# Patient Record
Sex: Male | Born: 1995 | Race: White | Hispanic: No | Marital: Married | Smoking: Never smoker
Health system: Southern US, Community
[De-identification: ages and names within clinical notes are randomized; demographics above are authoritative.]

## PROBLEM LIST (undated history)

## (undated) DIAGNOSIS — M25519 Pain in unspecified shoulder: Secondary | ICD-10-CM

## (undated) DIAGNOSIS — Z8719 Personal history of other diseases of the digestive system: Secondary | ICD-10-CM

## (undated) DIAGNOSIS — Z8709 Personal history of other diseases of the respiratory system: Secondary | ICD-10-CM

## (undated) DIAGNOSIS — M7542 Impingement syndrome of left shoulder: Secondary | ICD-10-CM

## (undated) DIAGNOSIS — R203 Hyperesthesia: Secondary | ICD-10-CM

## (undated) HISTORY — PX: VASECTOMY: SHX75

---
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
## 2015-09-18 ENCOUNTER — Emergency Department (HOSPITAL_COMMUNITY)
Admission: EM | Admit: 2015-09-18 | Discharge: 2015-09-19 | Disposition: A | Discharge status: Home / Self Care | Payer: Worker's Compensation | Attending: Emergency Medicine | Admitting: Emergency Medicine

## 2015-09-18 ENCOUNTER — Encounter (HOSPITAL_COMMUNITY): Payer: Self-pay

## 2015-09-18 DIAGNOSIS — J45909 Unspecified asthma, uncomplicated: Secondary | ICD-10-CM | POA: Insufficient documentation

## 2015-09-18 DIAGNOSIS — Z8719 Personal history of other diseases of the digestive system: Secondary | ICD-10-CM | POA: Insufficient documentation

## 2015-09-18 DIAGNOSIS — Z8739 Personal history of other diseases of the musculoskeletal system and connective tissue: Secondary | ICD-10-CM | POA: Diagnosis not present

## 2015-09-18 DIAGNOSIS — E860 Dehydration: Secondary | ICD-10-CM | POA: Diagnosis not present

## 2015-09-18 DIAGNOSIS — R42 Dizziness and giddiness: Secondary | ICD-10-CM

## 2015-09-18 LAB — CBC WITH DIFFERENTIAL/PLATELET
BASOS ABS: 0 10*3/uL (ref 0.0–0.1)
BASOS PCT: 1 %
EOS ABS: 0.1 10*3/uL (ref 0.0–0.7)
Eosinophils Relative: 1 %
HCT: 35.7 % — ABNORMAL LOW (ref 39.0–52.0)
HEMOGLOBIN: 12.7 g/dL — AB (ref 13.0–17.0)
Lymphocytes Relative: 19 %
Lymphs Abs: 1.2 10*3/uL (ref 0.7–4.0)
MCH: 31.3 pg (ref 26.0–34.0)
MCHC: 35.6 g/dL (ref 30.0–36.0)
MCV: 87.9 fL (ref 78.0–100.0)
Monocytes Absolute: 0.6 10*3/uL (ref 0.1–1.0)
Monocytes Relative: 10 %
NEUTROS PCT: 69 %
Neutro Abs: 4.3 10*3/uL (ref 1.7–7.7)
Platelets: 152 10*3/uL (ref 150–400)
RBC: 4.06 MIL/uL — AB (ref 4.22–5.81)
RDW: 12.1 % (ref 11.5–15.5)
WBC: 6.2 10*3/uL (ref 4.0–10.5)

## 2015-09-18 LAB — BASIC METABOLIC PANEL
Anion gap: 5 (ref 5–15)
BUN: 14 mg/dL (ref 6–20)
CHLORIDE: 109 mmol/L (ref 101–111)
CO2: 25 mmol/L (ref 22–32)
CREATININE: 1.25 mg/dL — AB (ref 0.61–1.24)
Calcium: 8.4 mg/dL — ABNORMAL LOW (ref 8.9–10.3)
GFR calc non Af Amer: >60 mL/min (ref >60)
Glucose, Bld: 111 mg/dL — ABNORMAL HIGH (ref 65–99)
POTASSIUM: 4.1 mmol/L (ref 3.5–5.1)
SODIUM: 139 mmol/L (ref 135–145)

## 2015-09-18 MED ORDER — SODIUM CHLORIDE 0.9 % IV BOLUS (SEPSIS)
1000.0000 mL | Freq: Once | INTRAVENOUS | Status: AC
  Administered 2015-09-19: 1000 mL via INTRAVENOUS

## 2015-09-18 MED ORDER — SODIUM CHLORIDE 0.9 % IV BOLUS (SEPSIS): 1000.0000 mL | Freq: Once | INTRAVENOUS | Status: DC

## 2015-09-18 MED ORDER — MECLIZINE HCL 12.5 MG PO TABS
25.0000 mg | ORAL_TABLET | Freq: Once | ORAL | Status: AC
  Administered 2015-09-19: 25 mg via ORAL
  Filled 2015-09-18: qty 2

## 2015-09-18 NOTE — ED Notes (Signed)
Patient stated he felt dizzy while sitting, dizziness increased upon standing. Patient states he still feels like he is burning up.

## 2015-09-18 NOTE — ED Provider Notes (Signed)
CSN: [id number]     Arrival date & time 09/18/15  2206 History  By signing my name below, I, Gregory Murray, attest that this documentation has been prepared under the direction and in the presence of Shon Baton, MD. Electronically Signed: [name], ED Scribe. 09/18/2015. 11:30 PM.    Chief Complaint  Patient presents with  . Dizziness   The history is provided by the patient. No language interpreter was used.   HPI Comments: Gregory Murray is a 20 y.o. male with a PMHx of asthma who presents to the Emergency Department complaining of intermittent off-balance, room spinning, lightheaded dizziness onset 3 weeks ago, worsening significantly as of tonight. He notes he currently just feels "al little bit of room spinning." He denies a any recent drug or alcohol use. He notes exacerbation of the dizziness with head movement. He notes he has been staying well-hydrated. 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 Pt denies headaches, nausea, and fever.  Pt is allergic to morphine.   Past Medical History  Diagnosis Date  . Impingement syndrome, shoulder, left 05/2012    pain wakes him up at night  . AC joint pain 05/2012    left  . Sensitive skin     dryness face, neck, abd.  . History of asthma     as a child  . History of constipation     as a child   Past Surgical History  Procedure Laterality Date  . Knee arthroscopy w/ plica excision  12/04/2009    left  . Upper gi endoscopy  11/06/2006    had general anesthesia  . Meatotomy  04/23/2006  . Closed reduction nasal fracture  10/29/2004  . Shoulder arthroscopy  06/01/2012    Procedure: ARTHROSCOPY SHOULDER;  Surgeon: Velna Ochs, MD;  Location: [facility];  Service: Orthopedics;  Laterality: Left;  left shoulder arthroscopy with debridement and subacromial decompression   Family History  Problem Relation Age of Onset  . Hypertension Maternal Grandmother   . Heart disease Maternal Grandmother   . Diabetes  Maternal Grandfather   . Hypertension Maternal Grandfather   . Heart disease Maternal Grandfather   . Hypertension Paternal Grandmother   . Heart disease Paternal Grandmother   . Kidney disease Paternal Grandmother   . Hypertension Paternal Grandfather   . Heart disease Paternal Grandfather   . COPD Paternal Grandfather   . Seizures Mother     as a child  . Anesthesia problems Mother     wakes up aggressive and mean   Social History  Substance Use Topics  . Smoking status: Passive Smoke Exposure - Never Smoker  . Smokeless tobacco: Never Used     Comment: outside smokers at home  . Alcohol Use: No    Review of Systems  Constitutional: Negative for fever.  Gastrointestinal: Negative for nausea.  Neurological: Positive for dizziness and light-headedness. Negative for headaches.  All other systems reviewed and are negative.   Allergies  Morphine and related; Nickel; and Soap  Home Medications   Prior to Admission medications   Medication Sig Start Date End Date Taking? Authorizing Provider  meclizine (ANTIVERT) 25 MG tablet Take 1 tablet (25 mg total) by mouth 2 (two) times daily as needed for dizziness. 09/19/15   Shon Baton, MD   BP 114/73 mmHg  Pulse 69  Temp(Src) 97.9 F (36.6 C) (Oral)  Resp 18  Ht  (1.854 m)  Wt 175 lb (79.379 kg)  BMI  23.09 kg/m2  SpO2 100% Physical Exam  Constitutional: He is oriented to person, place, and time. He appears well-developed and well-nourished. No distress.  HENT:  Head: Normocephalic and atraumatic.  Mucous membranes moist  Eyes: Pupils are equal, round, and reactive to light.  Horizontal nystagmus noted on exam  Neck: Neck supple.  Cardiovascular: Normal rate, regular rhythm and normal heart sounds.   No murmur heard. Pulmonary/Chest: Effort normal and breath sounds normal. No respiratory distress. He has no wheezes.  Abdominal: Soft. Bowel sounds are normal. There is no tenderness. There is no rebound.   Musculoskeletal: He exhibits no edema.  Neurological: He is alert and oriented to person, place, and time.  Cranial nerves II through XII intact, 5 out of 5 strength in all 4 extremities  Skin: Skin is warm and dry.  Psychiatric: He has a normal mood and affect.  Nursing note and vitals reviewed.   ED Course  Procedures  DIAGNOSTIC STUDIES: Oxygen Saturation is 100% on RA, normal by my interpretation.    COORDINATION OF CARE: 11:29 PM - Discussed probable vertigo and plans to order diagnostic studies. Pt advised of plan for treatment and pt agrees.  Labs Review Labs Reviewed  CBC WITH DIFFERENTIAL/PLATELET - Abnormal; Notable for the following:    RBC 4.06 (*)    Hemoglobin 12.7 (*)    HCT 35.7 (*)    All other components within normal limits  BASIC METABOLIC PANEL - Abnormal; Notable for the following:    Glucose, Bld 111 (*)    Creatinine, Ser 1.25 (*)    Calcium 8.4 (*)    All other components within normal limits    Imaging Review No results found. I have personally reviewed and evaluated these images and lab results as part of my medical decision-making.   EKG Interpretation   Date/Time:  Tuesday September 18 2015 23:09:57 EST Ventricular Rate:  71 PR Interval:  114 QRS Duration: 87 QT Interval:  371 QTC Calculation: 403 R Axis:   73 Text Interpretation:  Sinus rhythm Borderline short PR interval Borderline  Q waves in lateral leads Early repolarization No prior for conparison  Confirmed by Brandie Lopes  MD, Mikaelyn Arthurs ([id number]) on 09/19/2015 1:21:28 AM      MDM   Final diagnoses:  Vertigo  Dehydration    Patient presents with dizziness. Ongoing for the last several weeks but worse tonight. He is nontoxic on exam. Nonfocal. He does describe the room spinning and off balance dizziness. Heart rate does elevate greater than 20 upon standing. There may be an elephant of orthostasis or dehydration. Patient was given fluids. However, given the nystatin was on exam,  vertigo is also a consideration. Patient was given meclizine. EKG is largely reassuring and basic labwork is unremarkable. Patient reports improvement of symptoms after fluids and meclizine.  After history, exam, and medical workup I feel the patient has been appropriately medically screened and is safe for discharge home. Pertinent diagnoses were discussed with the patient. Patient was given return precautions.  ,I personally performed the services described in this documentation, which was scribed in my presence. The recorded information has been reviewed and is accurate.   Shon Baton, MD 09/19/15 [phone]
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
## 2015-09-19 MED ORDER — MECLIZINE HCL 25 MG PO TABS: 25.0000 mg | ORAL_TABLET | Freq: Two times a day (BID) | ORAL | Status: DC | PRN

## 2015-09-19 NOTE — ED Notes (Signed)
Pt was ambulated around the nurses station a few times. Pt dines short of breath and no dizziness.

## 2015-09-19 NOTE — Discharge Instructions (Signed)
Benign Positional Vertigo °Vertigo is the feeling that you or your surroundings are moving when they are not. Benign positional vertigo is the most common form of vertigo. The cause of this condition is not serious (is benign). This condition is triggered by certain movements and positions (is positional). This condition can be dangerous if it occurs while you are doing something that could endanger you or others, such as driving.  °CAUSES °In many cases, the cause of this condition is not known. It may be caused by a disturbance in an area of the inner ear that helps your brain to sense movement and balance. This disturbance can be caused by a viral infection (labyrinthitis), head injury, or repetitive motion. °RISK FACTORS °This condition is more likely to develop in: °· Women. °· People who are 50 years of age or older. °SYMPTOMS °Symptoms of this condition usually happen when you move your head or your eyes in different directions. Symptoms may start suddenly, and they usually last for less than a minute. Symptoms may include: °· Loss of balance and falling. °· Feeling like you are spinning or moving. °· Feeling like your surroundings are spinning or moving. °· Nausea and vomiting. °· Blurred vision. °· Dizziness. °· Involuntary eye movement (nystagmus). °Symptoms can be mild and cause only slight annoyance, or they can be severe and interfere with daily life. Episodes of benign positional vertigo may return (recur) over time, and they may be triggered by certain movements. Symptoms may improve over time. °DIAGNOSIS °This condition is usually diagnosed by medical history and a physical exam of the head, neck, and ears. You may be referred to a health care provider who specializes in ear, nose, and throat (ENT) problems (otolaryngologist) or a provider who specializes in disorders of the nervous system (neurologist). You may have additional testing, including: °· MRI. °· A CT scan. °· Eye movement tests. Your  health care provider may ask you to change positions quickly while he or she watches you for symptoms of benign positional vertigo, such as nystagmus. Eye movement may be tested with an electronystagmogram (ENG), caloric stimulation, the Dix-Hallpike test, or the roll test. °· An electroencephalogram (EEG). This records electrical activity in your brain. °· Hearing tests. °TREATMENT °Usually, your health care provider will treat this by moving your head in specific positions to adjust your inner ear back to normal. Surgery may be needed in severe cases, but this is rare. In some cases, benign positional vertigo may resolve on its own in 2-4 weeks. °HOME CARE INSTRUCTIONS °Safety °· Move slowly. Avoid sudden body or head movements. °· Avoid driving. °· Avoid operating heavy machinery. °· Avoid doing any tasks that would be dangerous to you or others if a vertigo episode would occur. °· If you have trouble walking or keeping your balance, try using a cane for stability. If you feel dizzy or unstable, sit down right away. °· Return to your normal activities as told by your health care provider. Ask your health care provider what activities are safe for you. °General Instructions °· Take over-the-counter and prescription medicines only as told by your health care provider. °· Avoid certain positions or movements as told by your health care provider. °· Drink enough fluid to keep your urine clear or pale yellow. °· Keep all follow-up visits as told by your health care provider. This is important. °SEEK MEDICAL CARE IF: °· You have a fever. °· Your condition gets worse or you develop new symptoms. °· Your family or friends   notice any behavioral changes. °· Your nausea or vomiting gets worse. °· You have numbness or a "pins and needles" sensation. °SEEK IMMEDIATE MEDICAL CARE IF: °· You have difficulty speaking or moving. °· You are always dizzy. °· You faint. °· You develop severe headaches. °· You have weakness in your  legs or arms. °· You have changes in your hearing or vision. °· You develop a stiff neck. °· You develop sensitivity to light. °  °This information is not intended to replace advice given to you by your health care provider. Make sure you discuss any questions you have with your health care provider. °  °Document Released: 04/14/2006 Document Revised: 03/28/2015 Document Reviewed: 10/30/2014 °Elsevier Interactive Patient Education ©2016 [employer] ° °Dehydration, Adult °Dehydration is a condition in which you do not have enough fluid or water in your body. It happens when you take in less fluid than you lose. Vital organs such as the kidneys, brain, and heart cannot function without a proper amount of fluids. Any loss of fluids from the body can cause dehydration.  °Dehydration can range from mild to severe. This condition should be treated right away to help prevent it from becoming severe. °CAUSES  °This condition may be caused by: °· Vomiting. °· Diarrhea. °· Excessive sweating, such as when exercising in hot or humid weather. °· Not drinking enough fluid during strenuous exercise or during an illness. °· Excessive urine output. °· Fever. °· Certain medicines. °RISK FACTORS °This condition is more likely to develop in: °· People who are taking certain medicines that cause the body to lose excess fluid (diuretics).   °· People who have a chronic illness, such as diabetes, that may increase urination. °· Older adults.   °· People who live at high altitudes.   °· People who participate in endurance sports.   °SYMPTOMS  °Mild Dehydration °· Thirst. °· Dry lips. °· Slightly dry mouth. °· Dry, warm skin. °Moderate Dehydration °· Very dry mouth.   °· Muscle cramps.   °· Dark urine and decreased urine production.   °· Decreased tear production.   °· Headache.   °· Light-headedness, especially when you stand up from a sitting position.   °Severe Dehydration °· Changes in skin.   °¨ Cold and clammy skin.   °¨ Skin does  not spring back quickly when lightly pinched and released.   °· Changes in body fluids.   °¨ Extreme thirst.   °¨ No tears.   °¨ Not able to sweat when body temperature is high, such as in hot weather.   °¨ Minimal urine production.   °· Changes in vital signs.   °¨ Rapid, weak pulse (more than 100 beats per minute when you are sitting still).   °¨ Rapid breathing.   °¨ Low blood pressure.   °· Other changes.   °¨ Sunken eyes.   °¨ Cold hands and feet.   °¨ Confusion. °¨ Lethargy and difficulty being awakened. °¨ Fainting (syncope).   °¨ Short-term weight loss.   °¨ Unconsciousness. °DIAGNOSIS  °This condition may be diagnosed based on your symptoms. You may also have tests to determine how severe your dehydration is. These tests may include:  °· Urine tests.   °· Blood tests.   °TREATMENT  °Treatment for this condition depends on the severity. Mild or moderate dehydration can often be treated at home. Treatment should be started right away. Do not wait until dehydration becomes severe. Severe dehydration needs to be treated at the hospital. °Treatment for Mild Dehydration °· Drinking plenty of water to replace the fluid you have lost.   °· Replacing minerals in your blood (electrolytes) that you may have   Treatment for Moderate Dehydration  Consuming oral rehydration solution (ORS). Treatment for Severe Dehydration  Receiving fluid through an IV tube.   Receiving electrolyte solution through a feeding tube that is passed through your nose and into your stomach (nasogastric tube or NG tube).  Correcting any abnormalities in electrolytes. HOME CARE INSTRUCTIONS   Drink enough fluid to keep your urine clear or pale yellow.   Drink water or fluid slowly by taking small sips. You can also try sucking on ice cubes.  Have food or beverages that contain electrolytes. Examples include bananas and sports drinks.  Take over-the-counter and prescription medicines only as told by your health care  provider.   Prepare ORS according to the manufacturer's instructions. Take sips of ORS every 5 minutes until your urine returns to normal.  If you have vomiting or diarrhea, continue to try to drink water, ORS, or both.   If you have diarrhea, avoid:   Beverages that contain caffeine.   Fruit juice.   Milk.   Carbonated soft drinks.  Do not take salt tablets. This can lead to the condition of having too much sodium in your body (hypernatremia).  SEEK MEDICAL CARE IF:  You cannot eat or drink without vomiting.  You have had moderate diarrhea during a period of more than 24 hours.  You have a fever. SEEK IMMEDIATE MEDICAL CARE IF:   You have extreme thirst.  You have severe diarrhea.  You have not urinated in 6-8 hours, or you have urinated only a small amount of very dark urine.  You have shriveled skin.  You are dizzy, confused, or both.   This information is not intended to replace advice given to you by your health care provider. Make sure you discuss any questions you have with your health care provider.   Document Released: 07/07/2005 Document Revised: 03/28/2015 Document Reviewed: 11/22/2014 Elsevier Interactive Patient Education [employer]

## 2015-09-19 NOTE — ED Notes (Signed)
Pt received 1000 ml NS bolus from EMS

## 2015-09-19 NOTE — ED Notes (Signed)
Patient given discharge instruction, verbalized understand. IV removed, band aid applied. Patient ambulatory out of the department.  

## 2020-02-15 DIAGNOSIS — M2392 Unspecified internal derangement of left knee: Secondary | ICD-10-CM | POA: Diagnosis not present

## 2020-02-15 DIAGNOSIS — E663 Overweight: Secondary | ICD-10-CM | POA: Diagnosis not present

## 2020-02-15 DIAGNOSIS — M25562 Pain in left knee: Secondary | ICD-10-CM | POA: Diagnosis not present

## 2020-02-15 DIAGNOSIS — S8392XA Sprain of unspecified site of left knee, initial encounter: Secondary | ICD-10-CM | POA: Diagnosis not present

## 2020-03-13 DIAGNOSIS — M25562 Pain in left knee: Secondary | ICD-10-CM | POA: Diagnosis not present

## 2020-03-13 DIAGNOSIS — M2392 Unspecified internal derangement of left knee: Secondary | ICD-10-CM | POA: Diagnosis not present

## 2020-04-13 DIAGNOSIS — M25562 Pain in left knee: Secondary | ICD-10-CM | POA: Diagnosis not present

## 2020-04-13 DIAGNOSIS — M2392 Unspecified internal derangement of left knee: Secondary | ICD-10-CM | POA: Diagnosis not present

## 2021-02-13 DIAGNOSIS — Z3009 Encounter for other general counseling and advice on contraception: Secondary | ICD-10-CM | POA: Diagnosis not present

## 2021-05-13 DIAGNOSIS — Z302 Encounter for sterilization: Secondary | ICD-10-CM | POA: Diagnosis not present

## 2021-05-30 DIAGNOSIS — R6889 Other general symptoms and signs: Secondary | ICD-10-CM | POA: Diagnosis not present

## 2021-07-29 DIAGNOSIS — Z0000 Encounter for general adult medical examination without abnormal findings: Secondary | ICD-10-CM | POA: Diagnosis not present

## 2021-07-29 DIAGNOSIS — R454 Irritability and anger: Secondary | ICD-10-CM | POA: Diagnosis not present

## 2021-07-29 DIAGNOSIS — Z6830 Body mass index (BMI) 30.0-30.9, adult: Secondary | ICD-10-CM | POA: Diagnosis not present

## 2021-07-29 DIAGNOSIS — E669 Obesity, unspecified: Secondary | ICD-10-CM | POA: Diagnosis not present

## 2021-07-29 DIAGNOSIS — E782 Mixed hyperlipidemia: Secondary | ICD-10-CM | POA: Diagnosis not present

## 2021-07-29 DIAGNOSIS — Z13220 Encounter for screening for lipoid disorders: Secondary | ICD-10-CM | POA: Diagnosis not present

## 2021-07-29 DIAGNOSIS — R0683 Snoring: Secondary | ICD-10-CM | POA: Diagnosis not present

## 2021-07-29 DIAGNOSIS — R5383 Other fatigue: Secondary | ICD-10-CM | POA: Diagnosis not present

## 2021-08-05 DIAGNOSIS — G4730 Sleep apnea, unspecified: Secondary | ICD-10-CM | POA: Diagnosis not present

## 2021-09-13 DIAGNOSIS — Z6829 Body mass index (BMI) 29.0-29.9, adult: Secondary | ICD-10-CM | POA: Diagnosis not present

## 2021-09-13 DIAGNOSIS — J209 Acute bronchitis, unspecified: Secondary | ICD-10-CM | POA: Diagnosis not present

## 2021-09-13 DIAGNOSIS — E6609 Other obesity due to excess calories: Secondary | ICD-10-CM | POA: Diagnosis not present

## 2022-02-21 DIAGNOSIS — L03311 Cellulitis of abdominal wall: Secondary | ICD-10-CM | POA: Diagnosis not present

## 2022-02-21 DIAGNOSIS — Z6828 Body mass index (BMI) 28.0-28.9, adult: Secondary | ICD-10-CM | POA: Diagnosis not present

## 2022-02-21 DIAGNOSIS — E663 Overweight: Secondary | ICD-10-CM | POA: Diagnosis not present

## 2023-06-17 ENCOUNTER — Encounter: Payer: Self-pay | Admitting: Internal Medicine

## 2023-06-17 ENCOUNTER — Ambulatory Visit (INDEPENDENT_AMBULATORY_CARE_PROVIDER_SITE_OTHER): Payer: 59 | Admitting: Internal Medicine

## 2023-06-17 VITALS — BP 116/79 | HR 79 | Resp 16 | Ht 71.0 in | Wt 216.2 lb

## 2023-06-17 DIAGNOSIS — Z87898 Personal history of other specified conditions: Secondary | ICD-10-CM | POA: Diagnosis not present

## 2023-06-17 DIAGNOSIS — Z2821 Immunization not carried out because of patient refusal: Secondary | ICD-10-CM

## 2023-06-17 DIAGNOSIS — Z8709 Personal history of other diseases of the respiratory system: Secondary | ICD-10-CM | POA: Diagnosis not present

## 2023-06-17 NOTE — Patient Instructions (Signed)
It was a pleasure to see you today.  Thank you for giving [place] the opportunity to be involved in your care.  Below is a brief recap of your visit and next steps.  We will plan to see you again as needed.  Summary You have established care today No medication changes were made No labs ordered Follow up as needed

## 2023-06-17 NOTE — Assessment & Plan Note (Signed)
He endorses a history of childhood asthma.  Asymptomatic currently.  Pulmonary exam unremarkable.  He is not prescribed an albuterol inhaler.

## 2023-06-17 NOTE — Assessment & Plan Note (Signed)
Previously treated with as needed use of meclizine.  Denies any recent episodes.

## 2023-06-17 NOTE — Progress Notes (Signed)
New Patient Office Visit  Subjective    Patient ID: Gregory Murray, male    DOB: [birth year]  Age: 27 y.o. MRN: [id number]  CC:  Chief Complaint  Patient presents with   Establish Care    HPI Gregory Murray presents to establish care.  He is a 27 year old male who endorses a past medical history significant for vertigo and childhood asthma.  Not taking any medications currently.  Previously followed [facility].  Gregory Murray feeling well today.  He is asymptomatic and has no acute concerns to discuss aside from desiring to establish care.  He currently works as a IT sales professional.  He endorses smokeless tobacco use and occasional alcohol consumption.  Denies illicit drug use.  His family medical history is significant for ESRD and CHF.  Chronic medical conditions and outstanding preventative care items discussed today are individually addressed Below.   Outpatient Encounter Medications as of 06/17/2023  Medication Sig   [DISCONTINUED] meclizine (ANTIVERT) 25 MG tablet Take 1 tablet (25 mg total) by mouth 2 (two) times daily as needed for dizziness.   No facility-administered encounter medications on file as of 06/17/2023.    Past Medical History:  Diagnosis Date   AC joint pain 05/2012   left   History of asthma    as a child   History of constipation    as a child   Impingement syndrome, shoulder, left 05/2012   pain wakes him up at night   Sensitive skin    dryness face, neck, abd.    Past Surgical History:  Procedure Laterality Date   CLOSED REDUCTION NASAL FRACTURE  10/29/2004   KNEE ARTHROSCOPY W/ PLICA EXCISION  12/04/2009   left   MEATOTOMY  04/23/2006   SHOULDER ARTHROSCOPY  06/01/2012   Procedure: ARTHROSCOPY SHOULDER;  Surgeon: Velna Ochs, MD;  Location: [facility];  Service: Orthopedics;  Laterality: Left;  left shoulder arthroscopy with debridement and subacromial decompression   UPPER GI ENDOSCOPY  11/06/2006   had  general anesthesia   VASECTOMY      Family History  Problem Relation Age of Onset   Seizures Mother        as a child   Anesthesia problems Mother        wakes up aggressive and mean   Febrile seizures Mother    Hypertension Maternal Grandmother    Heart disease Maternal Grandmother    Diabetes Maternal Grandfather    Hypertension Maternal Grandfather    Heart disease Maternal Grandfather    Hypertension Paternal Grandmother    Heart disease Paternal Grandmother    Kidney disease Paternal Grandmother    Hypertension Paternal Grandfather    Heart disease Paternal Grandfather    COPD Paternal Grandfather     Social History   Socioeconomic History   Marital status: Married    Spouse name: Not on file   Number of children: 2   Years of education: Not on file   Highest education level: Not on file  Occupational History   Not on file  Tobacco Use   Smoking status: Never    Passive exposure: Yes   Smokeless tobacco: Never   Tobacco comments:    outside smokers at home  Substance and Sexual Activity   Alcohol use: No   Drug use: No   Sexual activity: Not on file  Other Topics Concern   Not on file  Social History Narrative   Not on file  Social Determinants of Health   Financial Resource Strain: Not on file  Food Insecurity: Not on file  Transportation Needs: Not on file  Physical Activity: Not on file  Stress: Not on file  Social Connections: Not on file  Intimate Partner Violence: Not on file   Review of Systems  Constitutional:  Negative for chills and fever.  HENT:  Negative for sore throat.   Respiratory:  Negative for cough and shortness of breath.   Cardiovascular:  Negative for chest pain, palpitations and leg swelling.  Gastrointestinal:  Negative for abdominal pain, blood in stool, constipation, diarrhea, nausea and vomiting.  Genitourinary:  Negative for dysuria and hematuria.  Musculoskeletal:  Negative for myalgias.  Skin:  Negative for itching  and rash.  Neurological:  Negative for dizziness and headaches.  Psychiatric/Behavioral:  Negative for depression and suicidal ideas.    Objective    BP 116/79   Pulse 79   Resp 16   Ht 5\' 11"  (1.803 m)   Wt 216 lb 3.2 oz (98.1 kg)   SpO2 96%   BMI 30.15 kg/m   Physical Exam Vitals reviewed.  Constitutional:      General: He is not in acute distress.    Appearance: Normal appearance. He is not ill-appearing.  HENT:     Head: Normocephalic and atraumatic.     Right Ear: External ear normal.     Left Ear: External ear normal.     Nose: Nose normal. No congestion or rhinorrhea.     Mouth/Throat:     Mouth: Mucous membranes are moist.     Pharynx: Oropharynx is clear.  Eyes:     General: No scleral icterus.    Extraocular Movements: Extraocular movements intact.     Conjunctiva/sclera: Conjunctivae normal.     Pupils: Pupils are equal, round, and reactive to light.  Cardiovascular:     Rate and Rhythm: Normal rate and regular rhythm.     Pulses: Normal pulses.     Heart sounds: Normal heart sounds. No murmur heard. Pulmonary:     Effort: Pulmonary effort is normal.     Breath sounds: Normal breath sounds. No wheezing, rhonchi or rales.  Abdominal:     General: Abdomen is flat. Bowel sounds are normal. There is no distension.     Palpations: Abdomen is soft.     Tenderness: There is no abdominal tenderness.  Musculoskeletal:        General: No swelling or deformity. Normal range of motion.     Cervical back: Normal range of motion.  Skin:    General: Skin is warm and dry.     Capillary Refill: Capillary refill takes less than 2 seconds.  Neurological:     General: No focal deficit present.     Mental Status: He is alert and oriented to person, place, and time.     Motor: No weakness.  Psychiatric:        Mood and Affect: Mood normal.        Behavior: Behavior normal.        Thought Content: Thought content normal.    Assessment & Plan:   Problem List Items  Addressed This Visit       History of asthma - Primary    He endorses a history of childhood asthma.  Asymptomatic currently.  Pulmonary exam unremarkable.  He is not prescribed an albuterol inhaler.      History of vertigo    Previously treated with as needed use  of meclizine.  Denies any recent episodes.      Return if symptoms worsen or fail to improve.   Billie Lade, MD

## 2023-08-14 LAB — LAB REPORT - SCANNED
Calcium: 9.1
EGFR: 84
TSH: 2.98 (ref 0.41–5.90)

## 2024-02-25 ENCOUNTER — Emergency Department (HOSPITAL_COMMUNITY)

## 2024-02-25 ENCOUNTER — Emergency Department (HOSPITAL_COMMUNITY)
Admission: EM | Admit: 2024-02-25 | Discharge: 2024-02-25 | Disposition: A | Discharge status: Home / Self Care | Attending: Emergency Medicine | Admitting: Emergency Medicine

## 2024-02-25 ENCOUNTER — Other Ambulatory Visit: Payer: Self-pay

## 2024-02-25 ENCOUNTER — Encounter (HOSPITAL_COMMUNITY): Payer: Self-pay

## 2024-02-25 DIAGNOSIS — M542 Cervicalgia: Secondary | ICD-10-CM | POA: Diagnosis not present

## 2024-02-25 DIAGNOSIS — S060X9A Concussion with loss of consciousness of unspecified duration, initial encounter: Secondary | ICD-10-CM | POA: Insufficient documentation

## 2024-02-25 DIAGNOSIS — Y92410 Unspecified street and highway as the place of occurrence of the external cause: Secondary | ICD-10-CM | POA: Diagnosis not present

## 2024-02-25 DIAGNOSIS — S40012A Contusion of left shoulder, initial encounter: Secondary | ICD-10-CM | POA: Diagnosis not present

## 2024-02-25 DIAGNOSIS — S0990XA Unspecified injury of head, initial encounter: Secondary | ICD-10-CM | POA: Diagnosis not present

## 2024-02-25 LAB — CBC WITH DIFFERENTIAL/PLATELET
Abs Immature Granulocytes: 0.02 K/uL (ref 0.00–0.07)
Basophils Absolute: 0 K/uL (ref 0.0–0.1)
Basophils Relative: 1 %
Eosinophils Absolute: 0.1 K/uL (ref 0.0–0.5)
Eosinophils Relative: 2 %
HCT: 44.5 % (ref 39.0–52.0)
Hemoglobin: 15.5 g/dL (ref 13.0–17.0)
Immature Granulocytes: 0 %
Lymphocytes Relative: 23 %
Lymphs Abs: 1.2 K/uL (ref 0.7–4.0)
MCH: 30.9 pg (ref 26.0–34.0)
MCHC: 34.8 g/dL (ref 30.0–36.0)
MCV: 88.6 fL (ref 80.0–100.0)
Monocytes Absolute: 0.5 K/uL (ref 0.1–1.0)
Monocytes Relative: 10 %
Neutro Abs: 3.3 K/uL (ref 1.7–7.7)
Neutrophils Relative %: 64 %
Platelets: 166 K/uL (ref 150–400)
RBC: 5.02 MIL/uL (ref 4.22–5.81)
RDW: 11.6 % (ref 11.5–15.5)
WBC: 5.1 K/uL (ref 4.0–10.5)
nRBC: 0 % (ref 0.0–0.2)

## 2024-02-25 LAB — COMPREHENSIVE METABOLIC PANEL WITH GFR
ALT: 11 U/L (ref 0–44)
AST: 28 U/L (ref 15–41)
Albumin: 4.2 g/dL (ref 3.5–5.0)
Alkaline Phosphatase: 59 U/L (ref 38–126)
Anion gap: 7 (ref 5–15)
BUN: 11 mg/dL (ref 6–20)
CO2: 24 mmol/L (ref 22–32)
Calcium: 9.4 mg/dL (ref 8.9–10.3)
Chloride: 107 mmol/L (ref 98–111)
Creatinine, Ser: 1.16 mg/dL (ref 0.61–1.24)
GFR, Estimated: >60 mL/min (ref >60)
Glucose, Bld: 91 mg/dL (ref 70–99)
Potassium: 4.6 mmol/L (ref 3.5–5.1)
Sodium: 138 mmol/L (ref 135–145)
Total Bilirubin: 1.5 mg/dL — ABNORMAL HIGH (ref 0.0–1.2)
Total Protein: 6.4 g/dL — ABNORMAL LOW (ref 6.5–8.1)

## 2024-02-25 LAB — LIPASE, BLOOD: Lipase: 32 U/L (ref 11–51)

## 2024-02-25 MED ORDER — CYCLOBENZAPRINE HCL 10 MG PO TABS: 10.0000 mg | ORAL_TABLET | Freq: Two times a day (BID) | ORAL | 0 refills | Status: DC | PRN

## 2024-02-25 MED ORDER — KETOROLAC TROMETHAMINE 15 MG/ML IJ SOLN
15.0000 mg | Freq: Once | INTRAMUSCULAR | Status: AC
  Administered 2024-02-25: 15 mg via INTRAVENOUS
  Filled 2024-02-25: qty 1
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
## 2024-02-25 NOTE — ED Notes (Signed)
 Pt to XR

## 2024-03-01 ENCOUNTER — Ambulatory Visit (INDEPENDENT_AMBULATORY_CARE_PROVIDER_SITE_OTHER): Payer: Self-pay

## 2024-03-01 VITALS — BP 136/87 | HR 63 | Resp 16 | Ht 71.0 in | Wt 221.0 lb

## 2024-03-01 DIAGNOSIS — M546 Pain in thoracic spine: Secondary | ICD-10-CM

## 2024-03-01 MED ORDER — METHOCARBAMOL 750 MG PO TABS: 750.0000 mg | ORAL_TABLET | Freq: Three times a day (TID) | ORAL | 2 refills | Status: AC | PRN
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
## 2024-07-06 ENCOUNTER — Other Ambulatory Visit: Payer: Self-pay

## 2024-07-06 MED ORDER — AMOXICILLIN 875 MG PO TABS: 875.0000 mg | ORAL_TABLET | Freq: Two times a day (BID) | ORAL | 0 refills | Status: AC

## 2024-07-21 ENCOUNTER — Ambulatory Visit
Admission: EM | Admit: 2024-07-21 | Discharge: 2024-07-21 | Disposition: A | Discharge status: Home / Self Care | Source: Home / Self Care

## 2024-07-21 ENCOUNTER — Ambulatory Visit (INDEPENDENT_AMBULATORY_CARE_PROVIDER_SITE_OTHER)

## 2024-07-21 DIAGNOSIS — J209 Acute bronchitis, unspecified: Secondary | ICD-10-CM | POA: Diagnosis not present

## 2024-07-21 DIAGNOSIS — R051 Acute cough: Secondary | ICD-10-CM

## 2024-07-21 DIAGNOSIS — J3089 Other allergic rhinitis: Secondary | ICD-10-CM | POA: Diagnosis not present

## 2024-07-21 MED ORDER — AZELASTINE HCL 0.1 % NA SOLN: 1.0000 | Freq: Two times a day (BID) | NASAL | 0 refills | Status: AC

## 2024-07-21 MED ORDER — CETIRIZINE HCL 10 MG PO TABS: 10.0000 mg | ORAL_TABLET | Freq: Every day | ORAL | 2 refills | Status: AC

## 2024-07-21 MED ORDER — ALBUTEROL SULFATE HFA 108 (90 BASE) MCG/ACT IN AERS: 2.0000 | INHALATION_SPRAY | RESPIRATORY_TRACT | 0 refills | Status: AC | PRN

## 2024-07-21 MED ORDER — PROMETHAZINE-DM 6.25-15 MG/5ML PO SYRP: 5.0000 mL | ORAL_SOLUTION | Freq: Four times a day (QID) | ORAL | 0 refills | Status: AC | PRN

## 2024-07-21 NOTE — Discharge Instructions (Signed)
 I have sent over some allergy medications to include Zyrtec and Astelin nasal spray to be used consistently daily.  I have also sent in some cough syrup and an albuterol inhaler to be used as needed.  You may use Mucinex, saline sinus rinses, humidifiers and other home remedies.  Follow-up for worsening or unresolving symptoms.  Your chest x-ray came back negative for any acute abnormalities.
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
